# Patient Record
Sex: Female | Born: 1975 | Hispanic: Yes | Marital: Married | Smoking: Never smoker
Health system: Southern US, Community
[De-identification: ages and names within clinical notes are randomized; demographics above are authoritative.]

---

## 2013-07-28 ENCOUNTER — Emergency Department (HOSPITAL_COMMUNITY)
Admission: EM | Admit: 2013-07-28 | Discharge: 2013-07-29 | Disposition: A | Discharge status: Home / Self Care | Payer: Self-pay | Attending: Emergency Medicine | Admitting: Emergency Medicine

## 2013-07-28 ENCOUNTER — Encounter (HOSPITAL_COMMUNITY): Payer: Self-pay | Admitting: Emergency Medicine

## 2013-07-28 DIAGNOSIS — IMO0002 Reserved for concepts with insufficient information to code with codable children: Secondary | ICD-10-CM | POA: Insufficient documentation

## 2013-07-28 DIAGNOSIS — Z3202 Encounter for pregnancy test, result negative: Secondary | ICD-10-CM | POA: Insufficient documentation

## 2013-07-28 DIAGNOSIS — R102 Pelvic and perineal pain: Secondary | ICD-10-CM

## 2013-07-28 DIAGNOSIS — N949 Unspecified condition associated with female genital organs and menstrual cycle: Secondary | ICD-10-CM | POA: Insufficient documentation

## 2013-07-28 LAB — CBC WITH DIFFERENTIAL/PLATELET
BASOS ABS: 0 10*3/uL (ref 0.0–0.1)
BASOS PCT: 0 % (ref 0–1)
EOS ABS: 0.2 10*3/uL (ref 0.0–0.7)
EOS PCT: 2 % (ref 0–5)
HEMATOCRIT: 36.9 % (ref 36.0–46.0)
HEMOGLOBIN: 12.6 g/dL (ref 12.0–15.0)
Lymphocytes Relative: 32 % (ref 12–46)
Lymphs Abs: 2.9 10*3/uL (ref 0.7–4.0)
MCH: 28.8 pg (ref 26.0–34.0)
MCHC: 34.1 g/dL (ref 30.0–36.0)
MCV: 84.2 fL (ref 78.0–100.0)
MONOS PCT: 5 % (ref 3–12)
Monocytes Absolute: 0.5 10*3/uL (ref 0.1–1.0)
NEUTROS ABS: 5.5 10*3/uL (ref 1.7–7.7)
Neutrophils Relative %: 61 % (ref 43–77)
Platelets: 205 10*3/uL (ref 150–400)
RBC: 4.38 MIL/uL (ref 3.87–5.11)
RDW: 14 % (ref 11.5–15.5)
WBC: 9 10*3/uL (ref 4.0–10.5)

## 2013-07-28 LAB — COMPREHENSIVE METABOLIC PANEL
ALBUMIN: 3.9 g/dL (ref 3.5–5.2)
ALT: 57 U/L — ABNORMAL HIGH (ref 0–35)
AST: 34 U/L (ref 0–37)
Alkaline Phosphatase: 72 U/L (ref 39–117)
BUN: 18 mg/dL (ref 6–23)
CALCIUM: 9.2 mg/dL (ref 8.4–10.5)
CHLORIDE: 103 meq/L (ref 96–112)
CO2: 29 mEq/L (ref 19–32)
CREATININE: 0.48 mg/dL — AB (ref 0.50–1.10)
GFR calc Af Amer: >90 mL/min (ref >90)
GFR calc non Af Amer: >90 mL/min (ref >90)
Glucose, Bld: 100 mg/dL — ABNORMAL HIGH (ref 70–99)
Potassium: 3.8 mEq/L (ref 3.7–5.3)
Sodium: 141 mEq/L (ref 137–147)
Total Bilirubin: <0.2 mg/dL — ABNORMAL LOW (ref 0.3–1.2)
Total Protein: 7.4 g/dL (ref 6.0–8.3)

## 2013-07-28 LAB — URINALYSIS, ROUTINE W REFLEX MICROSCOPIC
BILIRUBIN URINE: NEGATIVE
GLUCOSE, UA: NEGATIVE mg/dL
KETONES UR: NEGATIVE mg/dL
Leukocytes, UA: NEGATIVE
Nitrite: NEGATIVE
Protein, ur: NEGATIVE mg/dL
Specific Gravity, Urine: 1.01 (ref 1.005–1.030)
Urobilinogen, UA: 0.2 mg/dL (ref 0.0–1.0)
pH: 7 (ref 5.0–8.0)

## 2013-07-28 LAB — URINE MICROSCOPIC-ADD ON

## 2013-07-28 LAB — PREGNANCY, URINE: PREG TEST UR: NEGATIVE

## 2013-07-28 NOTE — ED Notes (Signed)
The pt has had lower abd pain for one week with sl nausea.  lmp  Dec 25th

## 2013-07-29 ENCOUNTER — Emergency Department (HOSPITAL_COMMUNITY): Payer: Self-pay

## 2013-07-29 LAB — WET PREP, GENITAL
Trich, Wet Prep: NONE SEEN
WBC, Wet Prep HPF POC: NONE SEEN
YEAST WET PREP: NONE SEEN

## 2013-07-29 LAB — GC/CHLAMYDIA PROBE AMP
CT Probe RNA: NEGATIVE
GC Probe RNA: NEGATIVE

## 2013-07-29 MED ORDER — NAPROXEN 500 MG PO TABS: 500.0000 mg | ORAL_TABLET | Freq: Two times a day (BID) | ORAL | Status: AC

## 2013-07-29 MED ORDER — TRAMADOL HCL 50 MG PO TABS: 50.0000 mg | ORAL_TABLET | Freq: Four times a day (QID) | ORAL | Status: DC | PRN

## 2013-07-29 MED ORDER — NAPROXEN 250 MG PO TABS
500.0000 mg | ORAL_TABLET | Freq: Two times a day (BID) | ORAL | Status: DC
  Administered 2013-07-29: 500 mg via ORAL
  Filled 2013-07-29: qty 2

## 2013-07-29 NOTE — Discharge Instructions (Signed)
Su estudio diagnstico en la actualidad la sala de emergencia no ha mostrado una causa especfica para Agricultural consultant . Su ultrasonido no tena ninguna anormalidad . Usted necesitar ms estudio diagnstico de Music therapist, en la clnica de ginecologa. Utilice el nmero que aparece a continuacin para hacer una cita. Tome los medicamentos segn las indicaciones. Regreso a la sala de urgencias por empeoramiento de la enfermedad o nuevos sntoma preocupante.   Dolor abdominal en las mujeres (Abdominal Pain, Women) El dolor abdominal (en el estmago, la pelvis o el vientre) puede tener muchas causas. Es importante que le informe a su mdico:  La ubicacin del Engineer, mining.  Viene y se va, o persiste todo el tiempo?  Hay situaciones que Location manager (comer ciertos alimentos, la actividad fsica)?  Tiene otros sntomas asociados al dolor (fiebre, nuseas, vmitos, diarrea)? Todo es de gran ayuda cuando se trata de hallar la causa del dolor. CAUSAS  Estmago: Infecciones por virus o bacterias, o lcera.  Intestino: Apendicitis (apndice inflamado), ileitis regional (enfermedad de Crohn), colitis ulcerosa (colon inflamado), sndrome del colon irritable, diverticulitis (inflamacin de los divertculos del colon) o cncer de estmago oo intestino.  Enfermedades de la vescula biliar o clculos.  Enfermedades renales, clculos o infecciones en el rin.  Infeccin o cncer del pncreas.  Fibromialgia (trastorno doloroso)  Enfermedades de los rganos femeninos:  Uterus: tero: fibroma (tumor no canceroso) o infeccin  Trompas de Falopio: infeccin o embarazo ectpico  En los ovarios, quistes o tumores.  Adherencias plvicas (tejido cicatrizal).  Endometriosis (el tejido que cubre el tero se desarrolla en la pelvis y los rganos plvicos).  Sndrome de Agricultural engineer (los rganos femeninos se llenan de sangre antes del periodo menstrual(  Dolor durante el periodo menstrual.  Dolor  durante la ovulacin (al producir vulos).  Dolor al usar el DIU (dispositivo intrauterino para el control de la natalidad)  Psychologist, clinical los rganos femeninos.  Dolor funcional (no est originado en una enfermedad, puede mejorar sin tratamiento).  Dolor de origen psicolgico  Depresin. DIAGNSTICO Su mdico decidir la gravedad del dolor a travs del examen fsico  Anlisis de sangre  Radiografas  Ecografas  TC (tomografa computada, tipo especial de radiografas).  IMR (resonancia magntica)  Cultivos, en el caso una infeccin  Colon por enema de bario (se inserta una sustancia de contraste en el intestino grueso para mejorar la observacin con rayos X.)  Colonoscopa (observacin del intestino con un tubo luminoso).  Laparoscopa (examen del interior del abdomen con un tubo que tiene Intel Corporation).  Ciruga exploratoria abdominal mayor (se observa el abdomen realizando una gran incisin). TRATAMIENTO El tratamiento depender de la causa del problema.   Muchos de estos casos pueden controlarse y tratarse en casa.  Medicamentos de venta libre indicados por el mdico.  Medicamentos con receta.  Antibiticos, en caso de infeccin  Pldoras anticonceptivas, en el caso de perodos dolorosos o dolor al ovular.  Tratamiento hormonal, para la endometriosis  Inyecciones para bloqueo nervioso selectivo.  Fisioterapia.  Antidepresivos.  Consejos por parte de un psclogo o psiquiatra.  Ciruga mayor o menor. INSTRUCCIONES PARA EL CUIDADO DOMICILIARIO  No tome ni administre laxantes a menos que se lo haya indicado su mdico.  Tome analgsicos de venta libre slo si se lo ha indicado el profesional que lo asiste. No tome aspirina, ya que puede causar Apple Computer o hemorragias.  Consuma una dieta lquida (caldo o agua) segn lo indicado por el mdico. Progrese lentamente a Geophysical data processor  blanda, segn la tolerancia, si el dolor se relaciona con el estmago o el  intestino.  Tenga un termmetro y tmese la temperatura varias veces al da.  Haga reposo en la cama y [place], si esto Research scientist (life sciences).  Evite las relaciones sexuales, Counsellor.  Evite las situaciones estresantes.  Cumpla con las visitas y los anlisis de control, segn las indicaciones de su mdico.  Si el dolor no se [place] con los medicamentos o la [place], [place] tratar con:  Acupuntura.  Ejercicios de relajacin (yoga, meditacin).  Terapia grupal.  Psicoterapia. SOLICITE ATENCIN MDICA SI:  Nota que ciertos Pharmacist, community de [place].  El tratamiento indicado para Arboriculturist no Marketing executive.  Necesita analgsicos ms fuertes.  Quiere que le retiren el DIU.  Si se siente confundido o desfalleciente.  Presenta nuseas o vmitos.  Aparece una erupcin cutnea.  Sufre efectos adversos o una reaccin alrgica debido a los medicamentos que toma. SOLICITE ATENCIN MDICA DE INMEDIATO SI:  El dolor persiste o se agrava.  Tiene fiebre.  Siente el dolor slo en algunos sectores del abdomen. Si se localiza en la zona derecha, posiblemente podra tratarse de apendicitis. En un adulto, si se localiza en la regin inferior izquierda del abdomen, podra tratarse de colitis o diverticulitis.  Hay sangre en las heces (deposiciones de color rojo brillante o negro alquitranado), con o sin vmitos.  Usted presenta sangre en la orina.  Siente escalofros con o sin fiebre.  Se desmaya. ASEGRESE QUE:   Comprende estas instrucciones.  Controlar su enfermedad.  Solicitar ayuda de inmediato si no mejora o si empeora. Document Released: 10/23/2008 Document Revised: 09/29/2011 [facility] Patient Information 2014 [place], [place].  Dispareunia  (Dyspareunia) La dispareunia es el dolor durante las relaciones sexuales. Es ms comn en las mujeres, pero tambin ocurre [employer].  CAUSAS  Femenino El dolor se siente cuando algo  penetra en la vagina, pero cualquier parte de los genitales pueden causar dolor durante las relaciones sexuales. Puede sentir dolor incluso al sentarse o usar pantalones. En algunos casos no se conoce la causa. Algunas causas:   Infecciones de la piel que rodea la vagina.  Las infecciones vaginales, tales como hongos, bacterias o infeccin viral.  Vaginismo. Es la imposibilidad de Warehouse manager algo en la vagina, incluso queriendo [place]. Hay una contraccin muscular automtica y Engineer, mining. El dolor de la contraccin muscular puede ser tan intenso que el coito es imposible.  Reaccin alrgica a los espermicidas, semen, preservativos, tampones perfumados, jabones, duchas y [employer].  Un saco lleno de lquido (quiste) en las glndulas de [place] o de [place], que se encuentra en la apertura de la vagina.  Tejido cicatrizal en la vagina por un agrandamiento quirrgico de la abertura (episiotoma) o desgarro despus de Warehouse manager un beb.  Sequedad vaginal. Esto es ms frecuente en la menopausia. Las secreciones normales de la vagina estn disminuidas. Los [employer] Services niveles de estrgeno y la mayor dificultad para excitarse pueden causar dolor durante el sexo. La sequedad vaginal tambin puede ocurrir cuando se toman pldoras anticonceptivas.  Adelgazamiento del tejido (atrofia) de la vulva y la vagina. Esto hace que la zona sea ms delgada, ms pequea, incapaz de estirarse para acomodar el pene, y propensa a infecciones y Insurance account manager.  Vestibulitis vulvar o vestibulodinia. Esta es una enfermedad que causa dolor y afecta el rea alrededor de la entrada de la vagina. La causa ms comn en las mujeres jvenes son  las pldoras anticonceptivas. Las mujeres con niveles bajos de estrgenos (mujeres posmenopusicas) tambin pueden experimentarlo.Otras causas incluyen reacciones alrgicas, gran cantidad de terminaciones nerviosas, afecciones de la piel y msculos plvicos que no pueden  relajarse.  Dermatosis vulvar. Esto incluye enfermedades de la piel como el liquen escleroso y liquen plano.  Falta de juegos previos para la lubricacin de la vagina. Esto puede causar sequedad vaginal.  Los tumores no cancerosos (fibromas) en el tero.  El tejido que reviste internamente al tero se desarrolla fuera del mismo (endometriosis).  Embarazo que comienza en la trompa de Falopio (embarazo tubrico).  El embarazo o estar amamantando al beb. Esto puede causar sequedad vaginal.  Inclinacin o prolapso del tero. El prolapso se produce cuando los msculos dbiles y estirados alrededor del tero dejan que caiga en la vagina.  Problemas en los ovarios, quistes o cicatrices. Esto puede ser peor con ciertas posiciones sexuales.  Cirugas previas causando adherencias o tejido cicatrizal en la vagina o la pelvis.  Problemas en la vejiga e intestinales.  Problemas psicolgicos (como depresin o ansiedad). Esto puede hacer que el dolor empeore.  Actitudes negativas con respecto al sexo, haber sufrido una violacin, agresin sexual, y Warehouse manager. Estos problemas suelen estar relacionados con algunos tipos de dolor.  Infeccin plvica previa, causando un tejido cicatrizal en la pelvis y en los rganos femeninos.  Quiste o tumor en el ovario.  Cncer en los rganos femeninos.  Ciertos medicamentos.  Enfermedades como diabetes, artritis, o enfermedad de la tiroides. Masculino En los hombres, hay muchas causas fsicas del malestar sexual. Algunas causas:   Infecciones de la prstata, la vejiga, o las vesculas seminales. Pueden causar dolor despus de Automotive engineer.  Inflamacin de la vejiga (cistitis intersticial). Puede causar dolor durante la eyaculacin.  Infecciones por gonorrea. Puede causar dolor durante la eyaculacin.  Inflamacin de la uretra (uretritis) o inflamacin de la prstata (prostatitis) . Puede hacer que la estimulacin genital sea  dolorosa o incmoda.  Deformidades del pene como la enfermedad de [place].  Un prepucio estrecho.  Cncer en los rganos reproductivos masculinos.  Problemas psiclogicos. Esto puede hacer que el dolor empeore. DIAGNSTICO   El mdico har la historia clnica y tendr que describir el lugar donde se Optometrist (fuera de la vagina, en la vagina, en la pelvis). Le preguntar en qu momento experimenta el dolor, durante la penetracin o con los choques.  Luego el [employer] har un examen fsico. Hgale saber si sufre dolor durante el examen.  Durante la parte final del examen femenino, su mdico le palpar el tero y los ovarios con la mano sobre el abdomen y un dedo en la vagina. Es un examen plvico.  Le pedir anlisis de sangre, un [place]apan[place], cultivos en busca de infecciones, una ecografa y radiografas. Es posible que necesite ver a un especialista por problemas femeninos (gineclogo).  Su mdico indicar que se haga una tomografa computada, una resonancia magntica o una laparoscopia. La laparoscopia es un procedimiento para examinar la pelvis con un tubo con luz, a travs de un corte (incisin) en el abdomen. TRATAMIENTO  Su mdico determinar el mejor curso de Man. En algunos casos se solicitan ms pruebas. Contine con los estudios indicados hasta que el mdico est seguro acerca del diagnstico y [place]. A veces, es difcil de Veterinary surgeon causa del dolor. La bsqueda de la causa y el tratamiento pueden ser frustrantes. El tratamiento generalmente demora varias semanas o meses antes de notar [place]  mejora. Tambin puede ser necesario evitar la actividad sexual hasta que los sntomas mejoren. Si sigue teniendo Clinical research associaterelaciones sexuales cuando tiene dolor puede Engineer, manufacturingretrasar la curacin y Diplomatic Services operational officerempeorar el problema.  El tratamiento depende de la causa del dolor. Puede incluir:   Medicamentos como antibiticos, cremas vaginales, para la piel, hormonas, o  antidepresivos.  Ciruga mayor o menor.  El asesoramiento psicolgico o terapia de Lou­zagrupo.  Ejercicios de Kegel y dilatadores vaginales para ayudar en ciertos casos de vaginismo (espasmos). Hgalos slo si se lo recomienda su mdico. Los ejercicios Kegel pueden hacer que algunos problemas empeoren.  La aplicacin de un lubricante segn las indicaciones del mdico si tiene sequedad.  Terapia sexual para usted y su compaero. Es comn que el dolor contine despus de tratar la causa. Puede ser por Neomia Dearuna respuesta condicionada. Esto significa la persona se vuelve tan familiar con el dolor que persiste como El Veranorespuesta, aunque se elimine la causa. La terapia sexual puede ayudar con este problema.  INSTRUCCIONES PARA EL CUIDADO EN EL HOGAR   Siga las instrucciones de su mdico acerca de como Golden West Financialtomar los medicamentos, las [place]pruebas, terapias y visitas de control.  No use tampones, duchas vaginales , aerosoles vaginales o jabones perfumados.  Use lubricantes a base de agua para la sequedad. Los lubricantes con aceites pueden causar irritacin.  No utilice espermicidas o condones que Ecologistle irriten.  Hable abiertamente con su pareja sobre su experiencia sexual , sus deseos, el juego previo y las diferentes posiciones sexuales para Neomia Dearuna relacin sexual ms cmoda y Geographical information systems officeragradable.  nase a sesiones de Gabonterapia de [place]grupo, si es necesario.  Practique sexo seguro en todo momento.  Vace su vejiga antes de Management consultanttener relaciones sexuales.  Pruebe diferentes posiciones durante el coito.  Tome medicamentos de venta libre para Chief Technology Officerel dolor segn las indicaciones del mdico, antes de Management consultanttener relaciones sexuales.  No use panties. Use las medias que llegan a la altura de la rodilla o del muslo.  Evite frotar la vulva con un pao. Lave suavemente la zona y squela con una toalla dando golpecitos. SOLICITE ATENCIN MDICA SI:   Tiene hemorragias durante las The St. Paul Travelersrelaciones sexuales.  Observa un bulto en la abertura de la vagina,  aunque no sea doloroso.  Tiene flujo vaginal anormal.  Tiene sequedad vaginal.  Siente tiene picazn o irritacin de la vulva o la vagina.  Aparece una erupcin o tiene una reaccin a los medicamentos. SOLICITE ATENCIN MDICA DE INMEDIATO SI:   Siente dolor abdominal intenso durante o poco despus de las The St. Paul Travelersrelaciones sexuales. Usted podra haber sufrido la ruptura de un quiste ovrico o un embarazo ectpico.  Tiene fiebre.  Comienza a Financial risk analystsentir dolor al orinar u Centex Corporationobserva sangre.  Tiene relaciones sexuales dolorosas, y Forensic scientistnunca le ocurri antes.  Se desmaya despus de Management consultanttener relaciones sexuales. Document Released: 03/19/2011 Document Revised: 09/29/2011 [facility] Patient Information 2014 [place], [place].  Dolor plvico en la mujer  (Pelvic Pain, Female)  Las causa del dolor plvico en la mujer pueden ser muchas y pueden tener su origen en diferentes lugares. El dolor plvico es el que aparece en la mitad inferior del abdomen y Ontonagonentre las caderas. Puede aparecer durante en un perodo corto de tiempo (agudo)o puede ser recurrente (crnico). Esta afeccin puede estar relacionada con trastornos que afectan a los rganos reproductivos femeninos (ginecolgica), pero tambin puede deberse a problemas en la vejiga, clculos renales, complicaciones intestinales, o problemas musculares o esquelticos. Es Garment/textile technologistimportante solicitar ayuda de inmediato, sobre todo si ha sido intenso, [place] Creekagudo, o ha aparecido de  manera sbita como Herbalist inusual. Tambin es importante obtener ayuda de inmediato, ya que algunos tipos de dolor plvico puede poner en peligro la vida.  CAUSAS  A continuacin veremos algunas de las causas del dolor plvico. Las causas pueden clasificarse de diferentes modos.   Ginecolgica.  Enfermedad inflamatoria plvica.  Infecciones de transmisin sexual.  Quiste de ovario o torsin de un ligamento ovrico ( torsin ovrica).  La membrana que recubre internamente al tero desarrollndose fuera  del tero (endometriosis).  Fibromas, quistes o tumores.  Ovulacin.  Embarazo.  Embarazo fuera del tero (embarazo ectpico).  Aborto espontneo.  Trabajo de [place].  Desprendimiento de la placenta o ruptura del tero.  Infecciones.  Infeccin uterina (endometritis).  Infeccin de la vejiga.  Diverticulitis.  Aborto relacionado con una infeccin uterina (aborto sptico).  Vejiga.  Inflamacin de la vejiga (cistitis).  Clculos renales.  Gastrointenstinal.  Estreimiento.  Diverticulitis.  Neurolgico.  Traumatismos.  Sentir dolor plvico debido a causas mentales o emocionales (psicosomtico).  Tumores en el intestino o en la pelvis. EVALUACIN  El mdico har una historia clnica detallada segn sus sntomas. Incluir los cambios recientes en su salud, una cuidadosa historia ginecolgica de sus periodos (menstruaciones) y Neomia Dear historia de su actividad sexual. Los antecedentes familiares y la historia clnica tambin son importantes. Su mdico podr indicar un examen plvico. El examen plvico ayudar a identificar la ubicacin y la gravedad del Engineer, mining. Tambin ayudar a [employer] rganos que pueden estar involucrados. Myrtha Mantis identificar la causa del dolor plvico y tratarlo adecuadamente, el mdico puede indicar estudios. Estas pruebas pueden ser:   Test de embarazo.  Ecografa plvica.  Radiografa del abdomen.  Un anlisis de [place] o la evaluacin de la secrecin vaginal.  Anlisis de [place]. INSTRUCCIONES PARA EL CUIDADO EN EL HOGAR   Solo tome medicamentos de venta libre o recetados para Chief Technology Officer, Dentist o fiebre, segn las indicaciones del mdico.   Haga reposo segn las indicaciones del mdico.   Consuma una dieta balanceada.   Beba gran cantidad de lquido para mantener la orina de tono claro o amarillo plido.   Evite las relaciones sexuales, Counsellor.   Aplique compresas calientes o fras en la zona baja del abdomen  segn cual le calme el dolor.   Evite las situaciones estresantes.   Lleve un registro del dolor plvico. Anote cundo comenz, dnde se Optometrist y si hay cosas que parecen estar asociadas con el dolor, como algn alimento o su ciclo menstrual.  Concurra a las visitas de control con el mdico, segn las indicaciones.  SOLICITE ATENCIN MDICA SI:   Los medicamentos no Editor, commissioning.  Tiene flujo vaginal anormal. SOLICITE ATENCIN MDICA DE INMEDIATO SI:   Tiene un sangrado abundante por la vagina.   El dolor plvico aumenta.   Se siente mareada o sufre un desmayo.   Siente escalofros.   Siente dolor intenso al Geographical information systems officer u observa sangre en la orina.   Tiene diarrea o vmitos que no puede controlar.   Tiene fiebre o sntomas que persisten durante ms de [address]  [address]  Tiene fiebre y los sntomas [address].   Ha sido abusada fsica o sexualmente.  ASEGRESE DE QUE:   Comprende estas instrucciones.  Controlar su enfermedad.  Solicitar ayuda de inmediato si no mejora o si empeora. Document Released: 10/03/2008 Document Revised: 01/06/2012 [facility] Patient Information 2014 [place], [place].

## 2013-07-29 NOTE — ED Provider Notes (Signed)
CSN: [id number]     Arrival date & time 07/28/13  2000 History   First MD Initiated Contact with Patient 07/28/13 2258     Chief Complaint  Patient presents with  . Abdominal Pain   (Consider location/radiation/quality/duration/timing/severity/associated sxs/prior Treatment) HPI 38 year old female presents to emergency room from home with complaint of lower abdominal pain.  She reports pain ongoing for last 6-7 months, but worse over the past week.  Pain is worse in the morning and evening.  At time.  She feels that her stomach is distended and spongy.  She's been trying to treat symptoms with cinnamon tea.  She denies any fever or chills, no nausea or vomiting.  She has normal bowel movements.  She reports sexual intercourse is painful.  She feels that her vagina is dry, swollen and painful.  Swelling seems to be on the anterior inner wall.  She has not seen a doctor for these symptoms before.  Last menstrual period ended on 12/25.  She denies any urinary symptoms, no vaginal discharge, no new sexual partners.  History was obtained through an interpreter  History reviewed. No pertinent past medical history. History reviewed. No pertinent past surgical history. No family history on file. History  Substance Use Topics  . Smoking status: Never Smoker   . Smokeless tobacco: Not on file  . Alcohol Use: No   OB History   Grav Para Term Preterm Abortions TAB SAB Ect Mult Living                 Review of Systems  See History of Present Illness; otherwise all other systems are reviewed and negative Allergies  Review of patient's allergies indicates no known allergies.  Home Medications   Current Outpatient Rx  Name  Route  Sig  Dispense  Refill  . Multiple Vitamin (MULTIVITAMIN) tablet   Oral   Take 1 tablet by mouth daily.          BP 93/58  Pulse 63  Temp(Src) 97.8 F (36.6 C) (Oral)  Resp 18  SpO2 99%  LMP 07/14/2013 Physical Exam  Nursing note and vitals  reviewed. Constitutional: She is oriented to person, place, and time. She appears well-developed and well-nourished.  HENT:  Head: Normocephalic and atraumatic.  Right Ear: External ear normal.  Left Ear: External ear normal.  Nose: Nose normal.  Mouth/Throat: Oropharynx is clear and moist.  Eyes: Conjunctivae and EOM are normal. Pupils are equal, round, and reactive to light.  Neck: Normal range of motion. Neck supple. No JVD present. No tracheal deviation present. No thyromegaly present.  Cardiovascular: Normal rate, regular rhythm, normal heart sounds and intact distal pulses.  Exam reveals no gallop and no friction rub.   No murmur heard. Pulmonary/Chest: Effort normal and breath sounds normal. No stridor. No respiratory distress. She has no wheezes. She has no rales. She exhibits no tenderness.  Abdominal: Soft. Bowel sounds are normal. She exhibits no distension and no mass. There is tenderness (diffuse tenderness across lower abdomen/pelvic region.  Worse in left lower quadrant and suprapubic). There is no rebound and no guarding.  Genitourinary:  External genitalia normal Vagina with discharge Cervix closed no lesions No cervical motion tenderness Adnexa palpated, no masses but bilateral tenderness noted, worse in left adnexa Bladder palpated no tenderness Uterus palpated no masses , but diffuse tenderness    Musculoskeletal: Normal range of motion. She exhibits no edema and no tenderness.  Lymphadenopathy:    She has no cervical adenopathy.  Neurological:  She is alert and oriented to person, place, and time. She exhibits normal muscle tone. Coordination normal.  Skin: Skin is warm and dry. No rash noted. No erythema. No pallor.  Psychiatric: She has a normal mood and affect. Her behavior is normal. Judgment and thought content normal.    ED Course  Procedures (including critical care time) Labs Review Labs Reviewed  WET PREP, GENITAL - Abnormal; Notable for the following:     Clue Cells Wet Prep HPF POC FEW (*)    All other components within normal limits  URINALYSIS, ROUTINE W REFLEX MICROSCOPIC - Abnormal; Notable for the following:    Hgb urine dipstick TRACE (*)    All other components within normal limits  COMPREHENSIVE METABOLIC PANEL - Abnormal; Notable for the following:    Glucose, Bld 100 (*)    Creatinine, Ser 0.48 (*)    ALT 57 (*)    Total Bilirubin <0.2 (*)    All other components within normal limits  URINE MICROSCOPIC-ADD ON - Abnormal; Notable for the following:    Squamous Epithelial / LPF FEW (*)    All other components within normal limits  GC/CHLAMYDIA PROBE AMP  PREGNANCY, URINE  CBC WITH DIFFERENTIAL   Imaging Review US Transvaginal Non-ob  07/29/2013   CLINICAL DATA:  Vaginal and left adnexal pain with intercourse.  EXAM: TRANSABDOMINAL AND TRANSVAGINAL ULTRASOUND OF PELVIS  TECHNIQUE: Both transabdominal and transvaginal ultrasound examinations of the pelvis were performed. Transabdominal technique was performed for global imaging of the pelvis including uterus, ovaries, adnexal regions, and pelvic cul-de-sac. It was necessary to proceed with endovaginal exam following the transabdominal exam to visualize the adnexa.  COMPARISON:  None  FINDINGS: Uterus  Measurements: 7 x 7 x 4 cm. No fibroids or other mass visualized.  Endometrium  Thickness: 12 mm.  No focal abnormality visualized.  Right ovary  Not visualized.  Left ovary  Measurements: 3.4 x 2.3 x 2.1 cm. Normal appearance/no adnexal mass.  Other findings  No free fluid.  IMPRESSION: 1. Normal uterus and left ovary. 2. Right ovary not visualized.   Electronically Signed   By: Tiburcio Pea M.D.   On: 07/29/2013 05:05   US Pelvis Complete  07/29/2013   CLINICAL DATA:  Vaginal and left adnexal pain with intercourse.  EXAM: TRANSABDOMINAL AND TRANSVAGINAL ULTRASOUND OF PELVIS  TECHNIQUE: Both transabdominal and transvaginal ultrasound examinations of the pelvis were performed.  Transabdominal technique was performed for global imaging of the pelvis including uterus, ovaries, adnexal regions, and pelvic cul-de-sac. It was necessary to proceed with endovaginal exam following the transabdominal exam to visualize the adnexa.  COMPARISON:  None  FINDINGS: Uterus  Measurements: 7 x 7 x 4 cm. No fibroids or other mass visualized.  Endometrium  Thickness: 12 mm.  No focal abnormality visualized.  Right ovary  Not visualized.  Left ovary  Measurements: 3.4 x 2.3 x 2.1 cm. Normal appearance/no adnexal mass.  Other findings  No free fluid.  IMPRESSION: 1. Normal uterus and left ovary. 2. Right ovary not visualized.   Electronically Signed   By: Tiburcio Pea M.D.   On: 07/29/2013 05:05    EKG Interpretation   None       MDM   1. Pelvic pain   2. Dyspareunia    38 year old female with pelvic pain ongoing for several months, but worse over the last week.  Differential includes fibroid, ovarian cysts, adhesions.  Pelvic shows diffuse pain, worse on the left.  Differential could also  include ovary and portion, but I feel this is less likely, given ongoing chronic nature of her pain.  We'll get ultrasound tonight.  Will treat with NSAIDs and have her followup with women's clinic if ultrasound is unremarkable    Olivia Mackielga M Avondre Richens, MD 07/29/13 [phone]

## 2013-08-15 ENCOUNTER — Encounter: Payer: Self-pay | Admitting: Obstetrics & Gynecology

## 2013-08-15 ENCOUNTER — Ambulatory Visit (INDEPENDENT_AMBULATORY_CARE_PROVIDER_SITE_OTHER): Payer: Self-pay | Admitting: Obstetrics & Gynecology

## 2013-08-15 VITALS — BP 126/86 | HR 69 | Temp 97.1°F | Ht 59.0 in | Wt 130.2 lb

## 2013-08-15 DIAGNOSIS — R102 Pelvic and perineal pain: Secondary | ICD-10-CM

## 2013-08-15 DIAGNOSIS — N949 Unspecified condition associated with female genital organs and menstrual cycle: Secondary | ICD-10-CM

## 2013-08-15 NOTE — Progress Notes (Signed)
Subjective:     Patient ID: Alyssa Harris, female   DOB: June 04, 1976, 38 y.o.   MRN: [id number]  HPI Pt is a G1P1001 who presents with c/o pain for 'several years' which has become more severe.  She reports that her pain is much worse with intercourse.  She is s/p a c-section and she denies that she had pain prior to the c-section.  She c/o that the pain is worse with her menses.  She reports that this limits her daily activities.  History reviewed. No pertinent past medical history.  Past Surgical History  Procedure Laterality Date  . Cesarean section     Current Outpatient Prescriptions on File Prior to Visit  Medication Sig Dispense Refill  . naproxen (NAPROSYN) 500 MG tablet Take 1 tablet (500 mg total) by mouth 2 (two) times daily with a meal.  30 tablet  0   No current facility-administered medications on file prior to visit.   No Known Allergies       Review of Systems     Objective:   Physical Exam BP 126/86  Pulse 69  Temp(Src) 97.1 F (36.2 C) (Oral)  Ht 4\' 11"  (1.499 m)  Wt 130 lb 3.2 oz (59.058 kg)  BMI 26.28 kg/m2  LMP 08/11/2013 Pt in NAD Abd: soft, NT, ND GU: EGBUS: no lesions Vagina: no blood in vault Cervix: no lesion; no mucopurulent d/c Uterus: small, mobile Adnexa: no masses; tender to palpation on left side- there is an area of point tenderness it appears distinct from the ovary   CBC    Component Value Date/Time   WBC 9.0 07/28/2013 2026   RBC 4.38 07/28/2013 2026   HGB 12.6 07/28/2013 2026   HCT 36.9 07/28/2013 2026   PLT 205 07/28/2013 2026   MCV 84.2 07/28/2013 2026   MCH 28.8 07/28/2013 2026   MCHC 34.1 07/28/2013 2026   RDW 14.0 07/28/2013 2026   LYMPHSABS 2.9 07/28/2013 2026   MONOABS 0.5 07/28/2013 2026   EOSABS 0.2 07/28/2013 2026   BASOSABS 0.0 07/28/2013 2026         07/29/2013 TECHNIQUE:  Both transabdominal and transvaginal ultrasound examinations of the  pelvis were performed. Transabdominal technique was performed for  global  imaging of the pelvis including uterus, ovaries, adnexal  regions, and pelvic cul-de-sac. It was necessary to proceed with  endovaginal exam following the transabdominal exam to visualize the  adnexa.  COMPARISON: None  FINDINGS:  Uterus  Measurements: 7 x 7 x 4 cm. No fibroids or other mass visualized.  Endometrium  Thickness: 12 mm. No focal abnormality visualized.  Right ovary  Not visualized.  Left ovary  Measurements: 3.4 x 2.3 x 2.1 cm. Normal appearance/no adnexal mass.  Other findings  No free fluid.  IMPRESSION:  1. Normal uterus and left ovary.  2. Right ovary not visualized.       Assessment:     Pelvic pain with tenderness and possible small tender mass reproducible on exam.  DDX includes adhesions, endometriosis and fibroid not detected on sono.  Pt wants further eval         Plan:     Pt to complete financial aid paperwork. Patient desires surgical management with laparoscopy to evaluate pelvic pain.  The risks of surgery were discussed in detail with the patient including but not limited to: bleeding which may require transfusion or reoperation; infection which may require prolonged hospitalization or re-hospitalization and antibiotic therapy; injury to bowel, bladder, ureters and major vessels  or other surrounding organs; need for additional procedures including laparotomy; thromboembolic phenomenon, incisional problems and other postoperative or anesthesia complications.  The patient also understands the alternative treatment options which were discussed in full. All questions were answered.  She was told that she will be contacted by our surgical scheduler regarding the time and date of her surgery; routine preoperative instructions of having nothing to eat or drink after midnight on the day prior to surgery and also coming to the hospital 1 1/2 hours prior to her time of surgery were also emphasized.  She was told she may be called for a preoperative appointment about a  week prior to surgery and will be given further preoperative instructions at that visit. Printed patient education handouts about the procedure were given to the patient to review at home.

## 2013-08-15 NOTE — Patient Instructions (Addendum)
Laparoscopa diagnstica (Diagnostic Laparoscopy) La laparoscopa es un procedimiento quirrgico relativamente simple, de uso habitual y breve (menos de una hora) que se lleva a cabo para diagnosticar y tratar enfermedades del abdomen. El laparoscopio (tubo delgado, que emite luz, del tamao de un lpiz y similar a un telescopio) se inserta en el abdomen a travs de una pequea incisin (corte realizado por un cirujano). A travs de este instrumento, el profesional podr observar [employer] rganos del interior del abdomen (vientre) y ver si hay algo anormal. La laparoscopa podr llevarse a cabo tanto en el hospital como en un consultorio. Podrn administrarle un sedante suave que lo ayudar a relajarse antes y durante el procedimiento. Una vez en la sala de operaciones, le administrarn una anestesia general (a menos que usted y el profesional elijan otro tipo de anestesia). Despus de la laparoscopa, que generalmente dura menos de Leone Brand, ser eBay sala de recuperacin durante algunas horas. Cuando regrese a Medical illustrator, la Hotel manager [employer] y [place]. RIESGOS Y COMPLICACIONES Comparados con los beneficios, los riesgos de la laparoscopia son relativamente pocos. El Management consultant con usted los riesgos antes del procedimiento. Algunos problemas que pueden ocurrir luego de la intervencin son:  Infecciones.  Hemorragias.  Puede ocurrir que se lesionen otros rganos.  Efectos secundarios de Architect. PROCEDIMIENTO Una vez que se encuentra anestesiado, el cirujano insufla el abdomen con un gas inofensivo (dixido de carbono) para Museum/gallery exhibitions officer observacin de los rganos de la pelvis. El cirujano introduce el laparoscopio a travs de una pequea incisin en el ombligo o alrededor del mismo. Podr insertar otros instrumentos, como una sonda para mover los rganos o Optometrist algn procedimiento a travs de otra pequea incisin.  En ocasiones se  toma una biopsia (muestra de tejido) para un diagnstico ms preciso o para Conservator, museum/gallery. La biopsia consiste en tomar una pequea muestra de tejido durante la laparoscopia para enviarlo al patlogo (especialista en la observacin de clulas y muestras de tejido) y que lo examine en el microscopio para un diagnstico a nivel de los tejidos. DESPUES DEL PROCEDIMIENTO  Se libera el gas del abdomen.  Las incisiones se cierran con puntos (suturas). Debido a que las incisiones son pequeas (generalmente de menos de 1 cm) las molestias son mnimas luego del procedimiento. Es posible que sienta cierto Loss adjuster, chartered. Es consecuencia de Programmer, systems del tubo mientras se encontraba dormido. Es posible que sienta algn dolor abdominal no muy intenso. Tambin podr sentir [employer] en las incisiones realizadas para insertar los instrumentos en el abdomen.  El tiempo de recuperacin es reducido, siempre que no haya habido complicaciones.  Har reposo en la sala de recuperacin hasta que se encuentre estable y se sienta bien. Si no aparecen complicaciones, podr regresar a su casa. AVERIGE LOS RESULTADOS DE SU ANLISIS Durante su visita no contar con todos los [employer]. En este caso, tenga otra entrevista con su mdico para conocerlos. No piense que el resultado es normal si no tiene noticias de su mdico o de la institucin mdica. Es Building services engineer seguimiento de todos los [place] de [place]. INSTRUCCIONES [place] todos los medicamentos tal como se le indic.  Utilice los medicamentos de venta libre o de prescripcin para Conservation officer, historic buildings, Health and safety inspector o la [place], segn se lo indique el profesional que lo asiste.  Reanude las actividades habituales cuando se le indique.  Es preferible que se duche  y no tome baos de inmersin.  Reanude la actividad sexual luego de [place], o cuando lo autoricen.  No conduzca mientras se encuentre  bajo los efectos de narcticos. SOLICITE ATENCIN MDICA SI:  Siente un dolor abdominal inexplicable.  Siente dolor en los hombros, en la regin de los tirantes.  Si se siente aturdido o siente que se va a Artist.  Siente escalofros.  Usted o su nio tienen una temperatura oral de ms de 38,9 C (102 F).  Observa un drenaje purulento (similar al pus) que proviene de alguna de las heridas.  Usted o su hijo no puede realizar movimientos intestinales o evacuar gases.  Usted o su hijo sufren nuseas o vmitos. EST SEGURO QUE:   Comprende las instrucciones para el alta mdica.  Controlar su enfermedad.  Solicitar atencin mdica de inmediato segn las indicaciones. Document Released: 07/07/2005 Document Revised: 09/29/2011 [facility] Patient Information 2014 [place], [place]. Dolor plvico en la mujer  (Pelvic Pain, Female)  Las causa del dolor plvico en la mujer pueden ser muchas y pueden tener su origen en diferentes lugares. El dolor plvico es el que aparece en la mitad inferior del abdomen y [place] caderas. Puede aparecer durante en un perodo corto de tiempo (agudo)o puede ser recurrente (crnico). Esta afeccin puede estar relacionada con trastornos que afectan a los rganos reproductivos femeninos (ginecolgica), pero tambin puede deberse a problemas en la vejiga, clculos renales, complicaciones intestinales, o problemas musculares o esquelticos. Es Garment/textile technologist ayuda de inmediato, sobre todo si ha sido intenso, [place], o ha aparecido de Wellsite geologist sbita como un dolor inusual. Tambin es importante obtener ayuda de inmediato, ya que algunos tipos de dolor plvico puede poner en peligro la vida.  CAUSAS  A continuacin veremos algunas de las causas del dolor plvico. Las causas pueden clasificarse de diferentes modos.   Ginecolgica.  Enfermedad inflamatoria plvica.  Infecciones de transmisin sexual.  Quiste de ovario o torsin de un ligamento ovrico (  torsin ovrica).  La membrana que recubre internamente al tero desarrollndose fuera del tero (endometriosis).  Fibromas, quistes o tumores.  Ovulacin.  Embarazo.  Embarazo fuera del tero (embarazo ectpico).  Aborto espontneo.  Trabajo de [place].  Desprendimiento de la placenta o ruptura del tero.  Infecciones.  Infeccin uterina (endometritis).  Infeccin de la vejiga.  Diverticulitis.  Aborto relacionado con una infeccin uterina (aborto sptico).  Vejiga.  Inflamacin de la vejiga (cistitis).  Clculos renales.  Gastrointenstinal.  Estreimiento.  Diverticulitis.  Neurolgico.  Traumatismos.  Sentir dolor plvico debido a causas mentales o emocionales (psicosomtico).  Tumores en el intestino o en la pelvis. EVALUACIN  El mdico har una historia clnica detallada segn sus sntomas. Incluir los cambios recientes en su salud, una cuidadosa historia ginecolgica de sus periodos (menstruaciones) y Neomia Dear historia de su actividad sexual. Los antecedentes familiares y la historia clnica tambin son importantes. Su mdico podr indicar un examen plvico. El examen plvico ayudar a identificar la ubicacin y la gravedad del Engineer, mining. Tambin ayudar a [employer] rganos que pueden estar involucrados. Myrtha Mantis identificar la causa del dolor plvico y tratarlo adecuadamente, el mdico puede indicar estudios. Estas pruebas pueden ser:   Test de embarazo.  Ecografa plvica.  Radiografa del abdomen.  Un anlisis de [place] o la evaluacin de la secrecin vaginal.  Anlisis de [place]. INSTRUCCIONES PARA EL CUIDADO EN EL HOGAR   Solo tome medicamentos de venta libre o recetados para Chief Technology Officer, Dentist o fiebre, segn las indicaciones del mdico.  Haga reposo segn las indicaciones del mdico.   Consuma una dieta balanceada.   Beba gran cantidad de lquido para mantener la orina de tono claro o amarillo plido.   Evite las relaciones sexuales, Buyer, retailsi le  producen dolor.   Aplique compresas calientes o fras en la zona baja del abdomen segn cual le calme el dolor.   Evite las situaciones estresantes.   Lleve un registro del dolor plvico. Anote cundo comenz, dnde se Optometristlocaliza el dolor y si hay cosas que parecen estar asociadas con el dolor, como algn alimento o su ciclo menstrual.  Concurra a las visitas de control con el mdico, segn las indicaciones.  SOLICITE ATENCIN MDICA SI:   Los medicamentos no Editor, commissioningle calman el dolor.  Tiene flujo vaginal anormal. SOLICITE ATENCIN MDICA DE INMEDIATO SI:   Tiene un sangrado abundante por la vagina.   El dolor plvico aumenta.   Se siente mareada o sufre un desmayo.   Siente escalofros.   Siente dolor intenso al Geographical information systems officerorinar u observa sangre en la orina.   Tiene diarrea o vmitos que no puede controlar.   Tiene fiebre o sntomas que persisten durante ms de [address].  Tiene fiebre y los sntomas [address].   Ha sido abusada fsica o sexualmente.  ASEGRESE DE QUE:   Comprende estas instrucciones.  Controlar su enfermedad.  Solicitar ayuda de inmediato si no mejora o si empeora. Document Released: 10/03/2008 Document Revised: 01/06/2012 [facility] Patient Information 2014 [place], [place].

## 2013-08-29 ENCOUNTER — Encounter (HOSPITAL_COMMUNITY): Payer: Self-pay | Admitting: Pharmacist

## 2013-09-12 ENCOUNTER — Encounter (HOSPITAL_COMMUNITY): Admission: RE | Payer: Self-pay | Source: Ambulatory Visit

## 2013-09-12 ENCOUNTER — Encounter (HOSPITAL_COMMUNITY): Payer: Self-pay | Admitting: Registered Nurse

## 2013-09-12 ENCOUNTER — Ambulatory Visit (HOSPITAL_COMMUNITY): Admission: RE | Admit: 2013-09-12 | Payer: Self-pay | Source: Ambulatory Visit | Admitting: Obstetrics & Gynecology

## 2013-09-12 SURGERY — LAPAROSCOPY, DIAGNOSTIC
Anesthesia: Choice | Site: Abdomen

## 2014-05-22 ENCOUNTER — Encounter: Payer: Self-pay | Admitting: Obstetrics & Gynecology

## 2014-07-24 IMAGING — US US PELVIS COMPLETE
1 series · 14 of 25 positions shown · non-contrast
Comparison: None

CLINICAL DATA: Vaginal and left adnexal pain with intercourse.



[Series 1: us pelvis complete · 0.22mm/px · 14 of 25 slices shown]
[im 1/25]
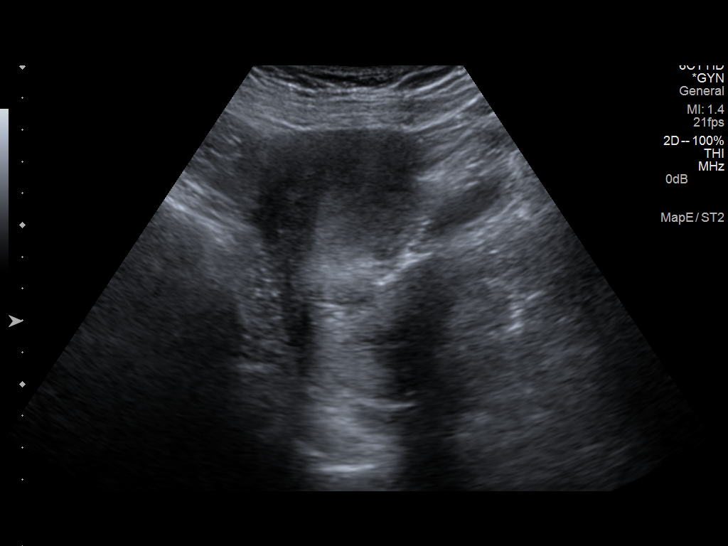
[im 3/25]
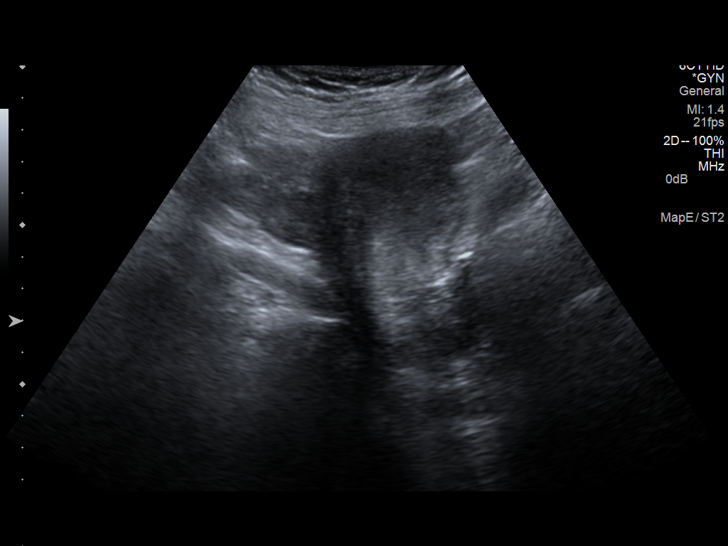
[im 5/25]
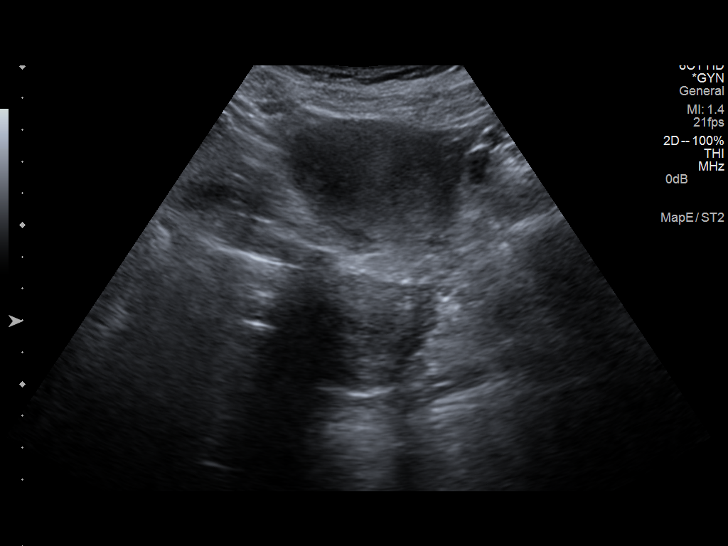
[im 7/25]
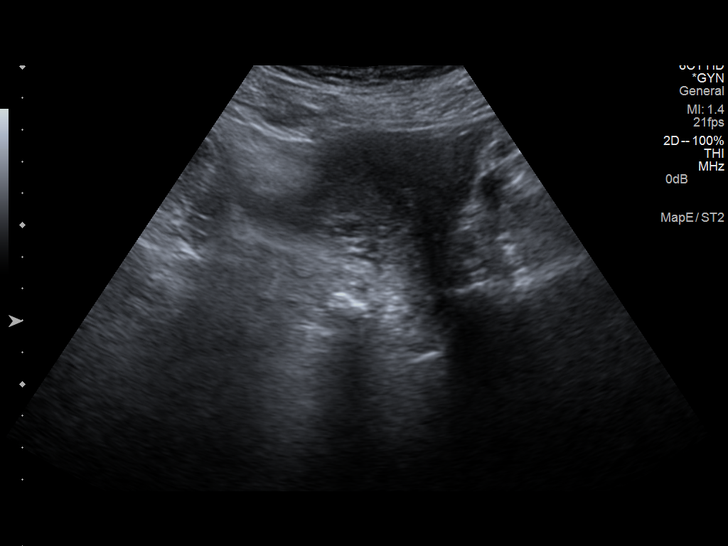
[im 9/25]
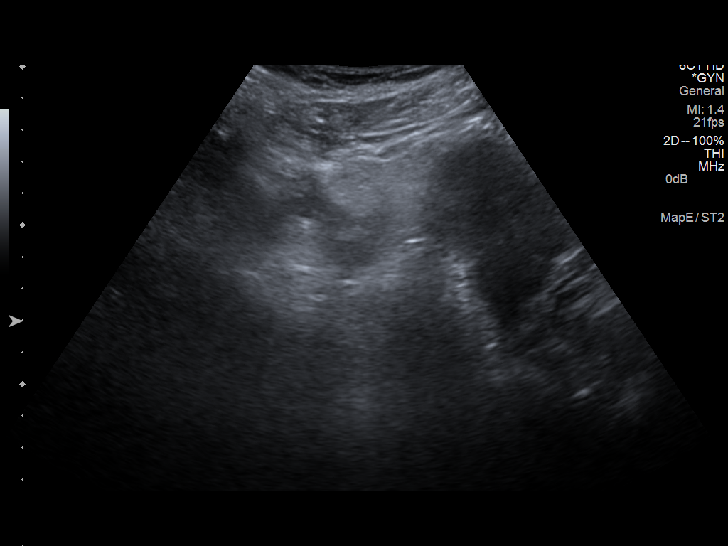
[im 10/25]
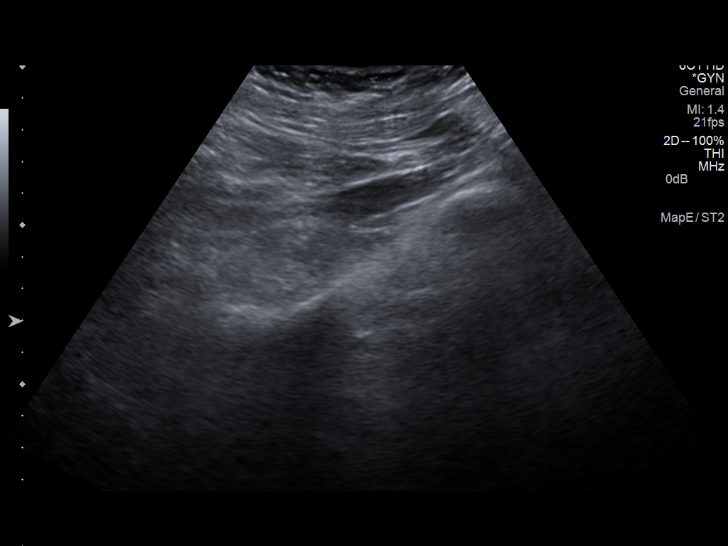
[im 12/25]
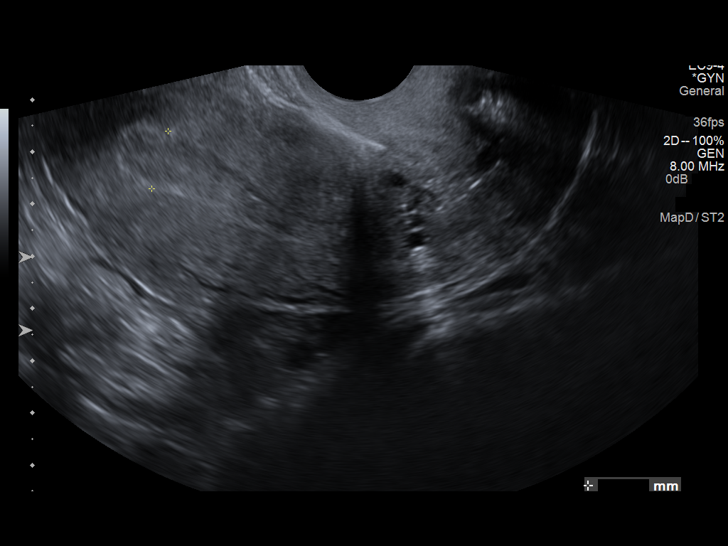
[im 14/25]
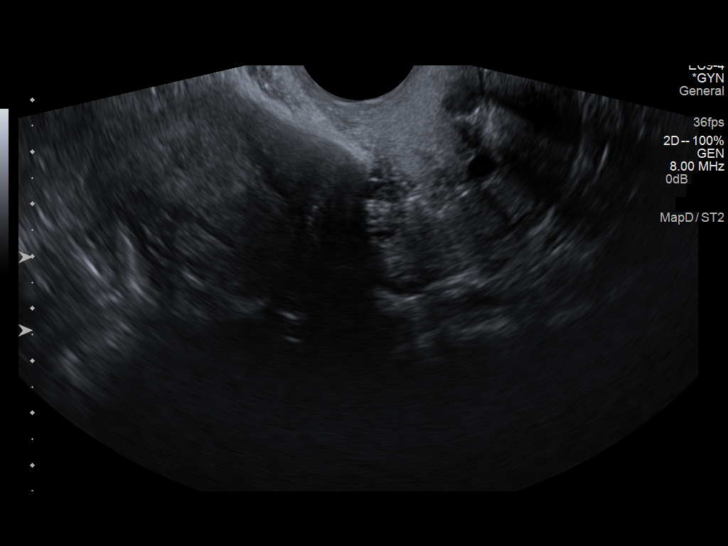
[im 16/25]
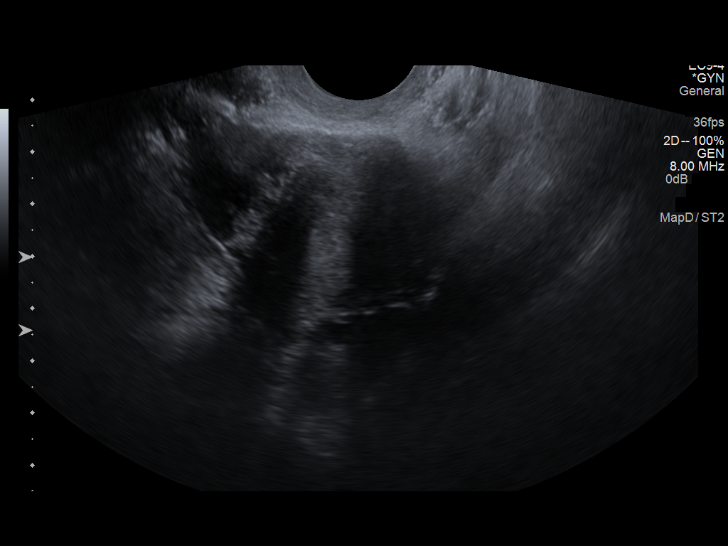
[im 17/25]
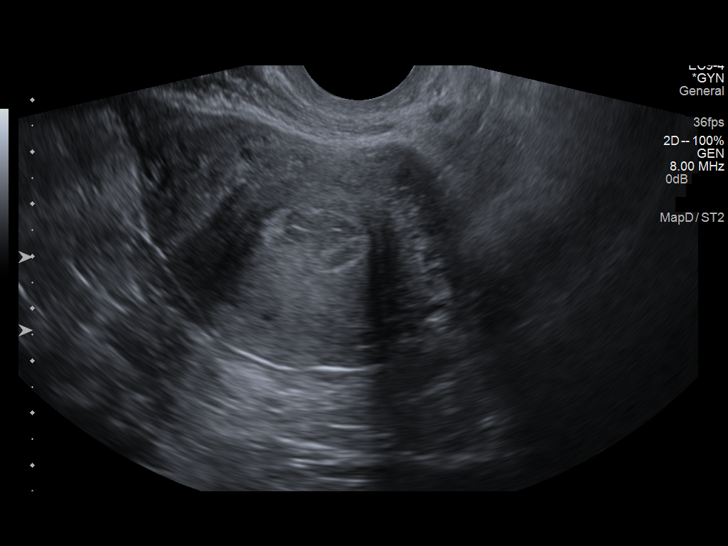
[im 19/25]
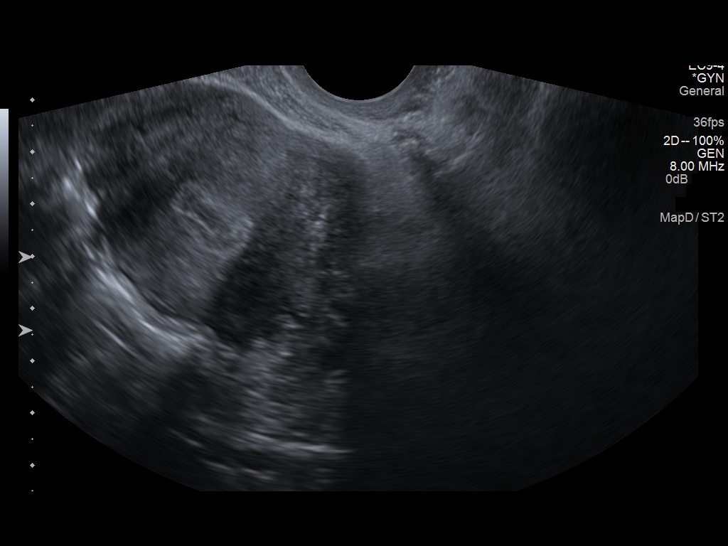
[im 21/25]
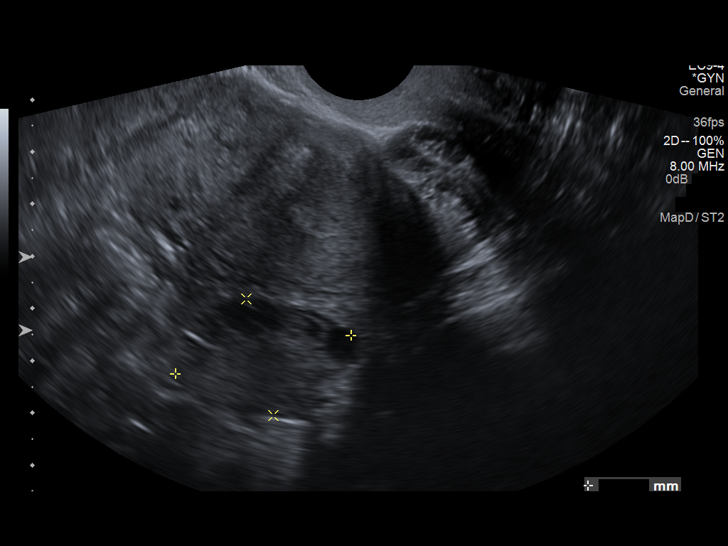
[im 23/25]
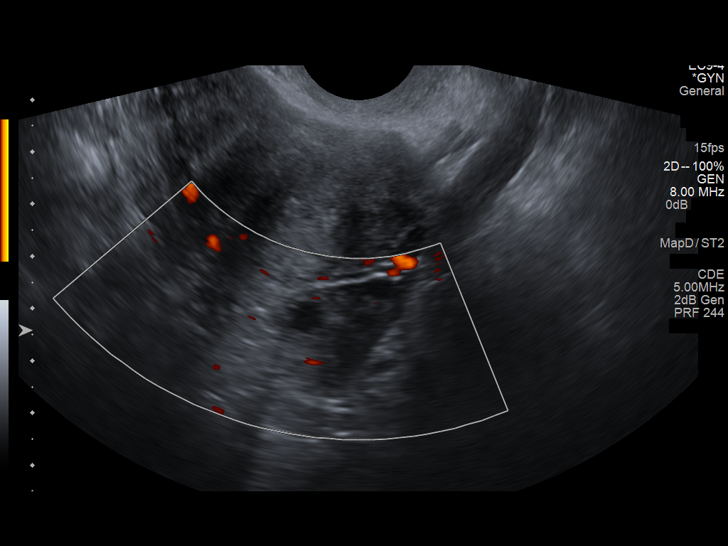
[im 25/25]
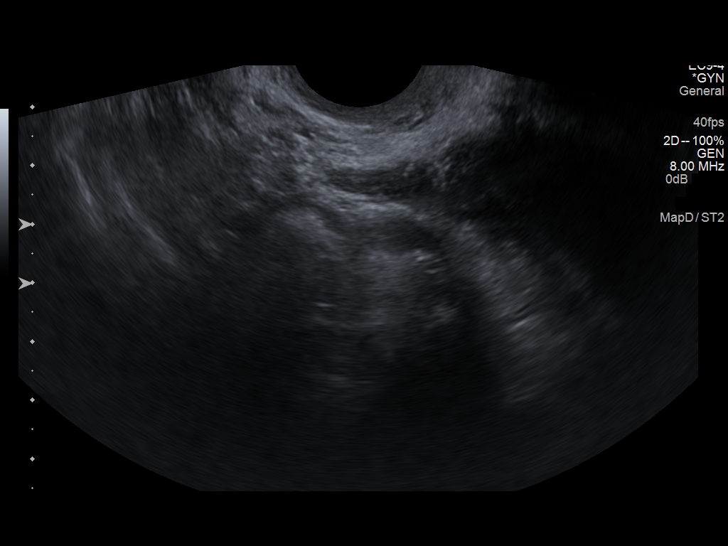

[14 of 25 positions shown; findings below may reference images not displayed]

FINDINGS: Uterus

Measurements: 7 x 7 x 4 cm. No fibroids or other mass visualized.

Endometrium

Thickness: 12 mm.  No focal abnormality visualized.

Right ovary

Not visualized.

Left ovary

Measurements: 3.4 x 2.3 x 2.1 cm. Normal appearance/no adnexal mass.

Other findings

No free fluid.
IMPRESSION: 1. Normal uterus and left ovary.
2. Right ovary not visualized.
